# Patient Record
Sex: Female | Born: 1970 | Hispanic: Yes | Marital: Single | Smoking: Never smoker
Health system: Southern US, Community
[De-identification: ages and names within clinical notes are randomized; demographics above are authoritative.]

## PROBLEM LIST (undated history)

## (undated) DIAGNOSIS — F419 Anxiety disorder, unspecified: Secondary | ICD-10-CM

## (undated) DIAGNOSIS — I10 Essential (primary) hypertension: Secondary | ICD-10-CM

## (undated) DIAGNOSIS — F32A Depression, unspecified: Secondary | ICD-10-CM

## (undated) HISTORY — DX: Anxiety disorder, unspecified: F41.9

## (undated) HISTORY — DX: Depression, unspecified: F32.A

## (undated) HISTORY — DX: Essential (primary) hypertension: I10

---

## 2004-01-11 ENCOUNTER — Other Ambulatory Visit: Payer: Self-pay

## 2004-01-15 ENCOUNTER — Other Ambulatory Visit: Payer: Self-pay

## 2008-11-09 ENCOUNTER — Emergency Department: Payer: Self-pay | Admitting: Emergency Medicine

## 2011-05-29 ENCOUNTER — Emergency Department: Payer: Self-pay | Admitting: Emergency Medicine

## 2019-04-19 ENCOUNTER — Other Ambulatory Visit: Payer: Self-pay | Admitting: *Deleted

## 2019-04-19 DIAGNOSIS — Z20822 Contact with and (suspected) exposure to covid-19: Secondary | ICD-10-CM

## 2019-04-25 LAB — NOVEL CORONAVIRUS, NAA: SARS-CoV-2, NAA: NOT DETECTED

## 2019-05-08 ENCOUNTER — Telehealth: Payer: Self-pay

## 2019-05-08 NOTE — Telephone Encounter (Signed)
Pt. Called back and given COVID 19 results.Spanish interpreter # [phone].

## 2019-05-19 ENCOUNTER — Other Ambulatory Visit: Payer: Self-pay

## 2019-05-19 DIAGNOSIS — Z20822 Contact with and (suspected) exposure to covid-19: Secondary | ICD-10-CM

## 2019-05-21 LAB — NOVEL CORONAVIRUS, NAA: SARS-CoV-2, NAA: DETECTED — AB

## 2019-05-23 ENCOUNTER — Emergency Department: Payer: Self-pay

## 2019-05-23 ENCOUNTER — Emergency Department
Admission: EM | Admit: 2019-05-23 | Discharge: 2019-05-23 | Disposition: A | Discharge status: Home / Self Care | Payer: Self-pay | Attending: Emergency Medicine | Admitting: Emergency Medicine

## 2019-05-23 ENCOUNTER — Other Ambulatory Visit: Payer: Self-pay

## 2019-05-23 DIAGNOSIS — U071 COVID-19: Secondary | ICD-10-CM | POA: Insufficient documentation

## 2019-05-23 DIAGNOSIS — R0602 Shortness of breath: Secondary | ICD-10-CM | POA: Insufficient documentation

## 2019-05-23 DIAGNOSIS — J988 Other specified respiratory disorders: Secondary | ICD-10-CM | POA: Insufficient documentation

## 2019-05-23 DIAGNOSIS — R509 Fever, unspecified: Secondary | ICD-10-CM | POA: Insufficient documentation

## 2019-05-23 LAB — BASIC METABOLIC PANEL
Anion gap: 9 (ref 5–15)
BUN: 5 mg/dL — ABNORMAL LOW (ref 6–20)
CO2: 23 mmol/L (ref 22–32)
Calcium: 8.3 mg/dL — ABNORMAL LOW (ref 8.9–10.3)
Chloride: 108 mmol/L (ref 98–111)
Creatinine, Ser: 0.58 mg/dL (ref 0.44–1.00)
GFR calc Af Amer: >60 mL/min (ref >60)
GFR calc non Af Amer: >60 mL/min (ref >60)
Glucose, Bld: 113 mg/dL — ABNORMAL HIGH (ref 70–99)
Potassium: 3.7 mmol/L (ref 3.5–5.1)
Sodium: 140 mmol/L (ref 135–145)

## 2019-05-23 LAB — CBC
HCT: 41.6 % (ref 36.0–46.0)
Hemoglobin: 13.9 g/dL (ref 12.0–15.0)
MCH: 31.4 pg (ref 26.0–34.0)
MCHC: 33.4 g/dL (ref 30.0–36.0)
MCV: 94.1 fL (ref 80.0–100.0)
Platelets: 177 10*3/uL (ref 150–400)
RBC: 4.42 MIL/uL (ref 3.87–5.11)
RDW: 12.7 % (ref 11.5–15.5)
WBC: 3.3 10*3/uL — ABNORMAL LOW (ref 4.0–10.5)
nRBC: 0 % (ref 0.0–0.2)

## 2019-05-23 LAB — TROPONIN I (HIGH SENSITIVITY): Troponin I (High Sensitivity): 7 ng/L (ref <18)

## 2019-05-23 LAB — POCT PREGNANCY, URINE: Preg Test, Ur: NEGATIVE

## 2019-05-23 MED ORDER — GUAIFENESIN-CODEINE 100-10 MG/5ML PO SOLN: 5.0000 mL | Freq: Four times a day (QID) | ORAL | 0 refills | Status: AC | PRN

## 2019-05-23 MED ORDER — SODIUM CHLORIDE 0.9% FLUSH: 3.0000 mL | Freq: Once | INTRAVENOUS | Status: DC

## 2019-05-23 NOTE — ED Provider Notes (Signed)
[facility] Emergency Department Provider Note  Time seen: 9:37 PM  I have reviewed the triage vital signs and the nursing notes.   HISTORY  Chief Complaint Chest Pain, Shortness of Breath, and Diarrhea   HPI Brenda Rivas is a 48 y.o. female with no significant past medical history presents to the emergency department for worsening cough, shortness of breath and fevers.  According to the patient for approximately 1 week she has been experiencing symptoms of cough shortness of breath and fever.  States her daughter was recently diagnosed with coronavirus, patient was ultimately tested and tested positive for coronavirus as well.  Patient states he feels like the cough and shortness of breath have worsened so she came back to the emergency department for evaluation.  Patient denies any chest pain.   Overall patient appears well, currently satting 99 to 100% on room air.  History reviewed. No pertinent past medical history.  There are no active problems to display for this patient.   History reviewed. No pertinent surgical history.  Prior to Admission medications   Not on File    No Known Allergies  No family history on file.  Social History Social History   Tobacco Use  . Smoking status: Never Smoker  Substance Use Topics  . Alcohol use: Not Currently  . Drug use: Not on file    Review of Systems Constitutional: Continues to have low-grade fevers at home per patient ENT: Negative for recent illness/congestion Cardiovascular: Negative for chest pain. Respiratory: Positive for shortness of breath.  Positive for cough. Gastrointestinal: Negative for abdominal pain Musculoskeletal: Negative for musculoskeletal complaints Skin: Negative for skin complaints  Neurological: Negative for headache All other ROS negative  ____________________________________________   PHYSICAL EXAM:  VITAL SIGNS: ED Triage Vitals  Enc Vitals Group     BP  05/23/19 1507 (!) 140/92     Pulse Rate 05/23/19 1507 94     Resp 05/23/19 1507 18     Temp 05/23/19 1507 99.2 F (37.3 C)     Temp Source 05/23/19 1507 Oral     SpO2 05/23/19 1507 95 %     Weight 05/23/19 1508 210 lb (95.3 kg)     Height 05/23/19 1508 5\' 6"  (1.676 m)     Head Circumference --      Peak Flow --      Pain Score 05/23/19 1508 4     Pain Loc --      Pain Edu? --      Excl. in [place]? --    Constitutional: Alert and oriented. Well appearing and in no distress. Eyes: Normal exam ENT      Head: Normocephalic and atraumatic.      Mouth/Throat: Mucous membranes are moist. Cardiovascular: Normal rate, regular rhythm.  Respiratory: Normal respiratory effort without tachypnea nor retractions. Breath sounds are clear.  No wheeze rales or rhonchi. Gastrointestinal: Soft and nontender. No distention.   Musculoskeletal: Nontender with normal range of motion in all extremities. Neurologic:  Normal speech and language. No gross focal neurologic deficits  Skin:  Skin is warm, dry and intact.  Psychiatric: Mood and affect are normal.  ____________________________________________    EKG  EKG viewed and interpreted by myself shows a sinus rhythm at 94 bpm with a narrow QRS, normal axis, normal intervals, no concerning ST changes.  ____________________________________________    RADIOLOGY  IMPRESSION:  Subtle patchy infiltrate left base. Atelectasis with questionable  earliest changes of infiltrate and right base. Lungs  elsewhere  clear. Cardiac silhouette within normal limits. No evident  adenopathy.   ____________________________________________   INITIAL IMPRESSION / ASSESSMENT AND PLAN / ED COURSE  Pertinent labs & imaging results that were available during my care of the patient were reviewed by me and considered in my medical decision making (see chart for details).   Patient presents to the emergency department for continued shortness of breath and cough, recently  diagnosed with COVID-19.  Overall the patient appears well, no acute distress currently satting 99 to 100% on room air, borderline low-grade temperature otherwise reassuring vitals including normal pulse rate and respiratory rate.  Clear lung sounds on exam.  Patient's chest x-ray does show likely mild infiltrates.  Overall the patient appears very well, work-up is largely reassuring.  Symptoms along with the patient's known COVID-19 diagnosis.  I will prescribe the patient a cough medication.  Patient has an albuterol inhaler at home that her daughter was prescribed, states she has been using it I instructed her to use the inhaler 2 puffs every 6 hours.  I also discussed Tylenol ibuprofen every 6 hours at home plenty of fluids and plenty of rest.  Patient agreeable to plan of care.  Brenda Rivas was evaluated in Emergency Department on 05/23/2019 for the symptoms described in the history of present illness. She was evaluated in the context of the global COVID-19 pandemic, which necessitated consideration that the patient might be at risk for infection with the SARS-CoV-2 virus that causes COVID-19. Institutional protocols and algorithms that pertain to the evaluation of patients at risk for COVID-19 are in a state of rapid change based on information released by regulatory bodies including the CDC and federal and state organizations. These policies and algorithms were followed during the patient's care in the ED.  ____________________________________________   FINAL CLINICAL IMPRESSION(S) / ED DIAGNOSES  COVID-19   Minna AntisPaduchowski, Wilmar Prabhakar, MD 05/23/19 2140

## 2019-05-23 NOTE — ED Triage Notes (Signed)
Pt c/o chest pain, diarrhea and SOB X 10 days gradually worsening. Pt recently dx with COVID19 .

## 2019-05-23 NOTE — ED Notes (Signed)
Patient to stat desk complaining of shortness of breath. Patient in no acute distress. Vital signs stable at this time.

## 2019-05-23 NOTE — ED Notes (Signed)
Dr. Kerman Passey at bedside, pt speakiing with him via [place] interpreter, pt speaking in complete sentences with no difficulty

## 2020-01-22 ENCOUNTER — Ambulatory Visit: Payer: Self-pay | Attending: Internal Medicine

## 2020-01-22 DIAGNOSIS — Z23 Encounter for immunization: Secondary | ICD-10-CM

## 2020-01-22 NOTE — Progress Notes (Signed)
   Covid-19 Vaccination Clinic  Name:  Brenda Rivas    MRN: [id number] DOB: 03/30/1971  01/22/2020  Ms. Brenda Rivas was observed post Covid-19 immunization for 15 minutes without incident. She was provided with Vaccine Information Sheet and instruction to access the V-Safe system.   Ms. Brenda Rivas was instructed to call 911 with any severe reactions post vaccine: Marland Kitchen Difficulty breathing  . Swelling of face and throat  . A fast heartbeat  . A bad rash all over body  . Dizziness and weakness   Immunizations Administered    Name Date Dose VIS Date Route   Pfizer COVID-19 Vaccine 01/22/2020  3:26 PM 0.3 mL 10/03/2019 Intramuscular   Manufacturer: [employer], [employer]   Lot: [id number]   NDC: [address]-3

## 2020-02-15 ENCOUNTER — Ambulatory Visit: Payer: Self-pay | Attending: Internal Medicine

## 2020-02-15 DIAGNOSIS — Z23 Encounter for immunization: Secondary | ICD-10-CM

## 2020-02-15 NOTE — Progress Notes (Signed)
   Covid-19 Vaccination Clinic  Name:  Twilia Yaklin    MRN: [id number] DOB: 1971-06-02  02/15/2020  Ms. Martie Muhlbauer was observed post Covid-19 immunization for 15 minutes without incident. She was provided with Vaccine Information Sheet and instruction to access the V-Safe system.   Ms. Marc Sivertsen was instructed to call 911 with any severe reactions post vaccine: Marland Kitchen Difficulty breathing  . Swelling of face and throat  . A fast heartbeat  . A bad rash all over body  . Dizziness and weakness   Immunizations Administered    Name Date Dose VIS Date Route   Pfizer COVID-19 Vaccine 02/15/2020  3:28 PM 0.3 mL 12/17/2018 Intramuscular   Manufacturer: [employer], [employer]   Lot: [id number]   NDC: [address]-9

## 2021-08-16 ENCOUNTER — Encounter: Payer: Self-pay | Admitting: Obstetrics and Gynecology

## 2021-10-05 ENCOUNTER — Other Ambulatory Visit: Payer: Self-pay

## 2021-10-05 ENCOUNTER — Ambulatory Visit: Payer: Self-pay | Attending: Oncology | Admitting: *Deleted

## 2021-10-05 ENCOUNTER — Encounter: Payer: Self-pay | Admitting: *Deleted

## 2021-10-05 ENCOUNTER — Ambulatory Visit
Admission: RE | Admit: 2021-10-05 | Discharge: 2021-10-05 | Disposition: A | Discharge status: Home / Self Care | Payer: Self-pay | Source: Ambulatory Visit | Attending: Oncology | Admitting: Oncology

## 2021-10-05 VITALS — BP 127/79 | HR 68 | Temp 99.7°F | Ht 61.5 in | Wt 206.0 lb

## 2021-10-05 DIAGNOSIS — Z0000 Encounter for general adult medical examination without abnormal findings: Secondary | ICD-10-CM

## 2021-10-05 NOTE — Progress Notes (Signed)
°  Subjective:     Patient ID: Brenda Rivas, female   DOB: 1971-06-09, 50 y.o.   MRN: [id number]  HPI  [facility] Medical History Record - 10/05/21 1517       Breast History   Screening cycle New    CBE Date 02/23/20    Provider (CBE) [facility]    Initial Mammogram 10/05/21    Last Mammogram Annual    Last Mammogram Date 01/09/14    Provider (Mammogram)  [facility]    Recent Breast Symptoms None      Breast Cancer History   Breast Cancer History No personal or family history      Previous History of Breast Problems   Breast Surgery or Biopsy None    Breast Implants N/A    BSE Done Monthly      Gynecological/Obstetrical History   LMP 10/03/21    Is there any chance that the client could be pregnant?  No    Age at menarche 7    Age at menopause NA - perimenopausal    PAP smear history Annually    Date of last PAP  02/23/20    Provider (PAP) [facility]    Age at first live birth 45    Breast fed children No    DES Exposure Unkown    Cervical, Uterine or Ovarian cancer No    Family history of Cervial, Uterine or Ovarian cancer No    Hysterectomy No    Cervix removed No    Ovaries removed No    Laser/Cryosurgery Yes    Current method of birth control Depoprovera    Current method of Estrogen/Hormone replacement None    Smoking history None               Review of Systems     Objective:   Physical Exam Chest:  Breasts:    Right: No swelling, bleeding, inverted nipple, mass, nipple discharge, skin change or tenderness.     Left: No swelling, bleeding, inverted nipple, mass, nipple discharge, skin change or tenderness.  Lymphadenopathy:     Upper Body:     Right upper body: No supraclavicular or axillary adenopathy.     Left upper body: No supraclavicular or axillary adenopathy.       Assessment:     50 year old Hispanic female referred to [facility] by [facility] for clinical breast exam and mammogram.  Kandis Cocking, the interpreter  present during the interview and exam.  Clinical breast exam is unremarkable.  Taught self breast awareness.  Last pap on 02/23/20 was negative without HPV co-testing.  Next pap due in 2024.  Patient has been screened for eligibility.  She does not have any insurance, Medicare or Medicaid.  She also meets financial eligibility.     Plan:     Screening mammogram ordered.  Will follow up per [facility] protocol.

## 2021-10-06 ENCOUNTER — Other Ambulatory Visit: Payer: Self-pay | Admitting: *Deleted

## 2021-10-06 ENCOUNTER — Inpatient Hospital Stay
Admission: RE | Admit: 2021-10-06 | Discharge: 2021-10-06 | Disposition: A | Discharge status: Home / Self Care | Payer: Self-pay | Source: Ambulatory Visit | Attending: *Deleted | Admitting: *Deleted

## 2021-10-06 DIAGNOSIS — Z1231 Encounter for screening mammogram for malignant neoplasm of breast: Secondary | ICD-10-CM

## 2021-10-10 ENCOUNTER — Encounter: Payer: Self-pay | Admitting: *Deleted

## 2021-10-10 NOTE — Progress Notes (Signed)
lam

## 2021-11-30 ENCOUNTER — Encounter: Payer: Self-pay | Admitting: *Deleted

## 2021-11-30 NOTE — Progress Notes (Addendum)
Received voicemail message that patient did not have her mammogram results.  Called and left patient a message to return my call.  Informed her that a result letter was mailed on 10/10/21, but for her to call me back and I would review the results with her.  Patient returned my call.  She was informed of her normal mammogram and need to follow up in one year for her annual screening.

## 2023-08-03 IMAGING — MG MM DIGITAL SCREENING BILAT W/ TOMO AND CAD
6 of 12 series · 6 of 36 positions shown · non-contrast
Comparison: Previous exam(s).

ACR Breast Density Category a: The breast tissue is almost entirely
fatty.

CLINICAL DATA: Screening.

EXAM:
DIGITAL SCREENING BILATERAL MAMMOGRAM WITH TOMOSYNTHESIS AND CAD
TECHNIQUE: Bilateral screening digital craniocaudal and mediolateral oblique
mammograms were obtained. Bilateral screening digital breast
tomosynthesis was performed. The images were evaluated with
computer-aided detection.

[L CC synth-2D (1 of 2)]
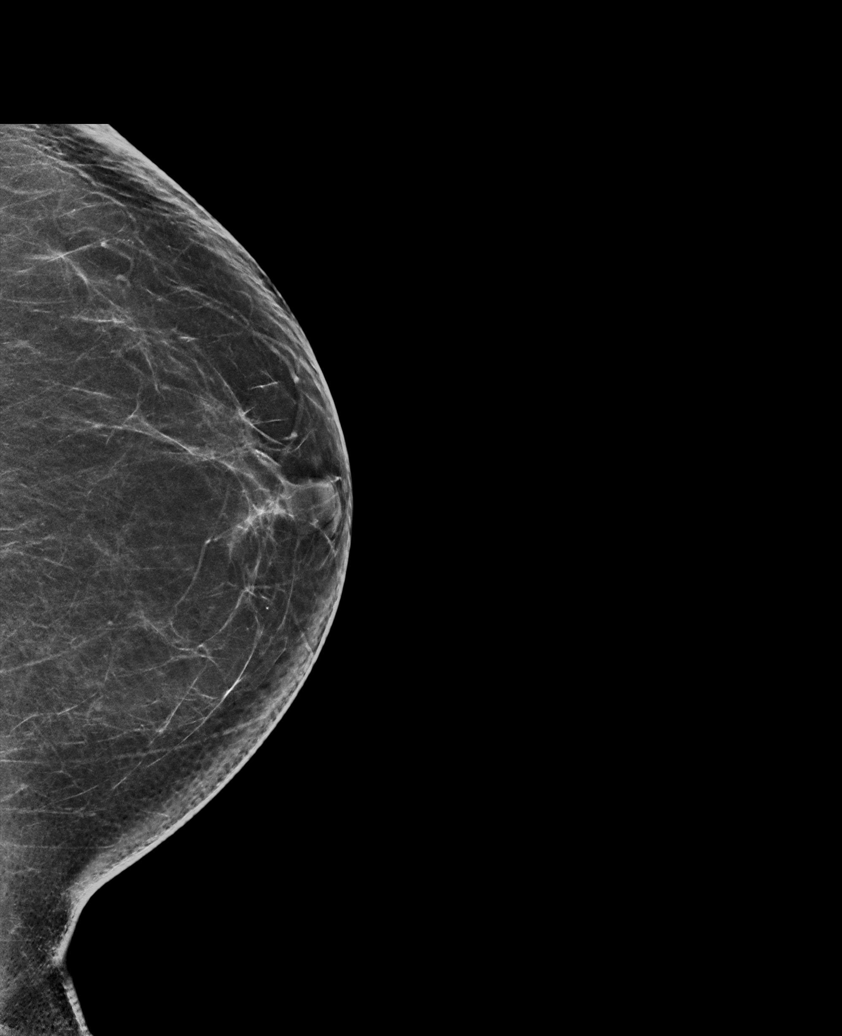

[R CC synth-2D (1 of 2)]
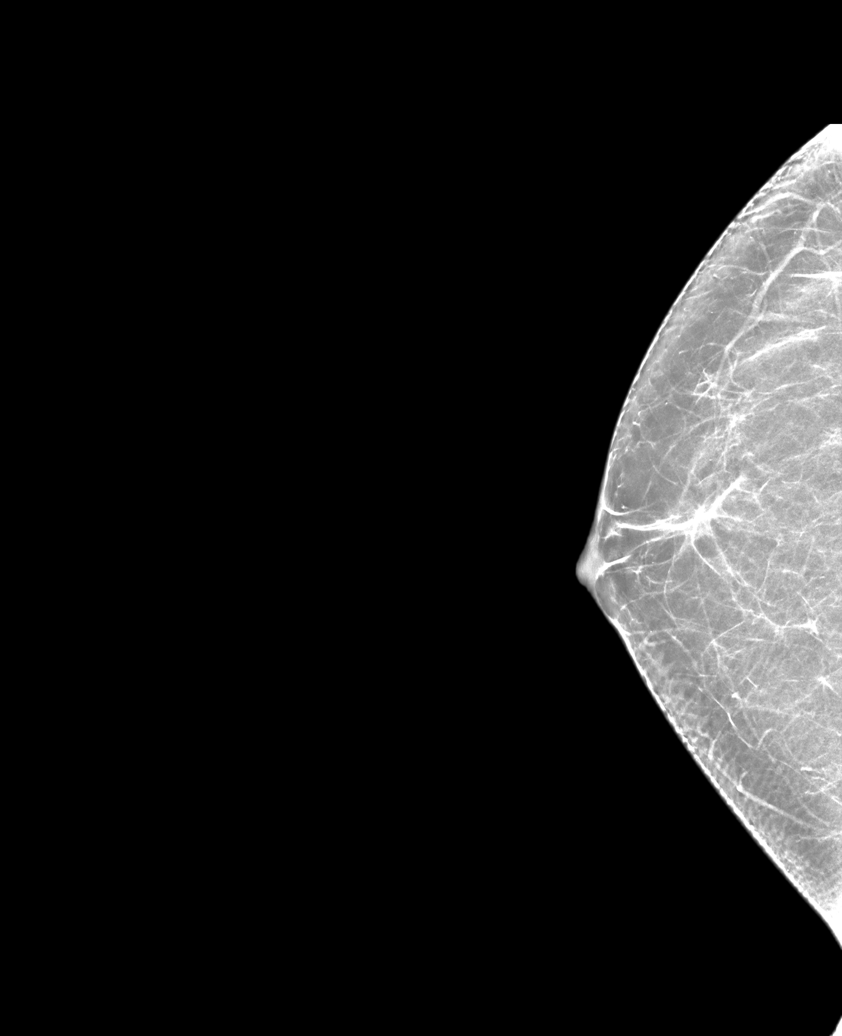

[R MLO synth-2D]
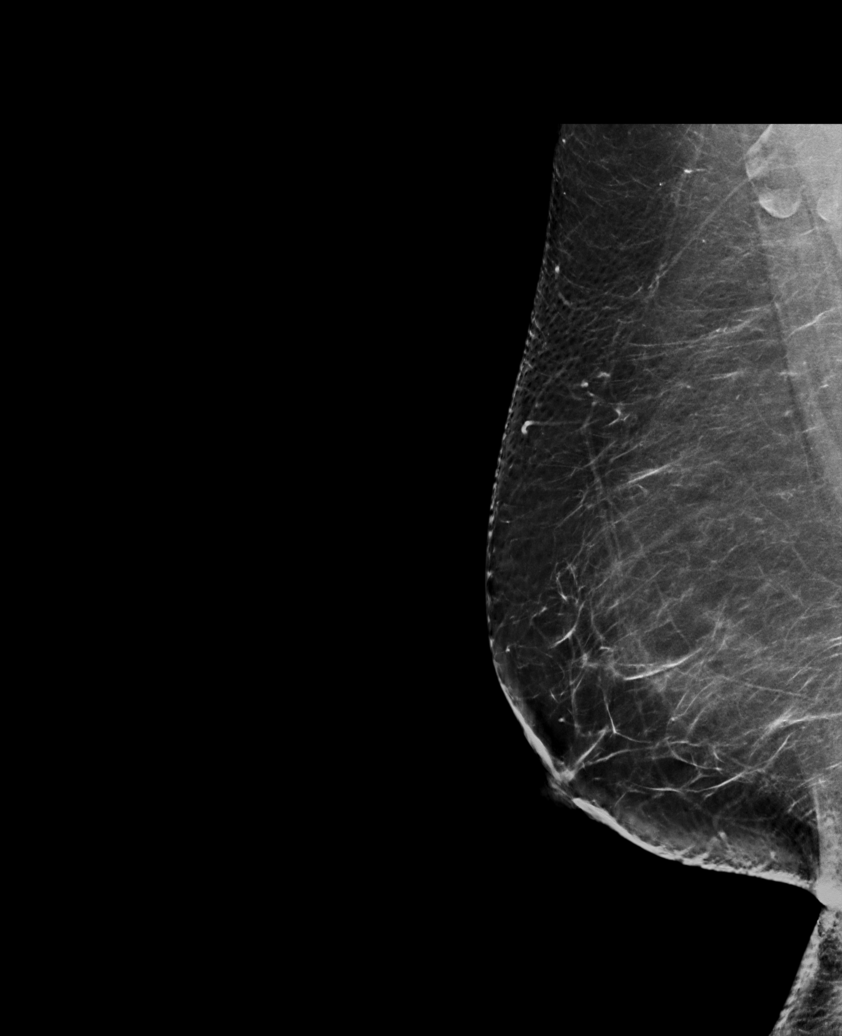

[L CC synth-2D (2 of 2)]
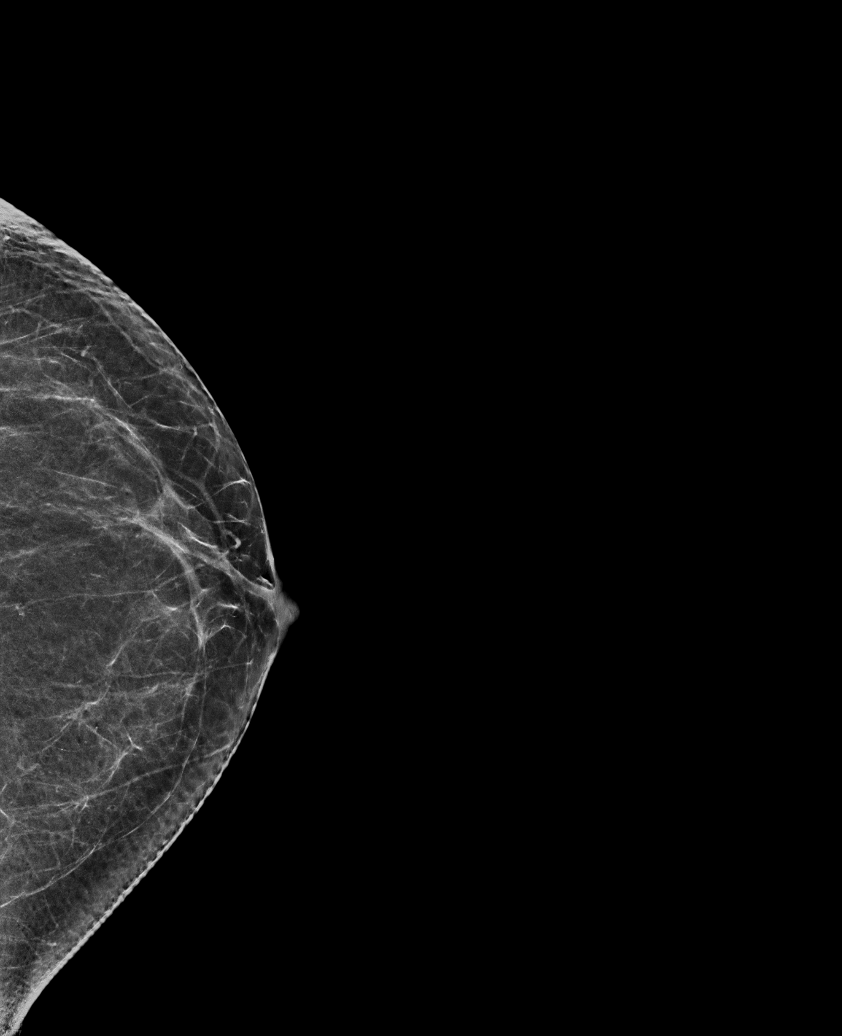

[L MLO synth-2D]
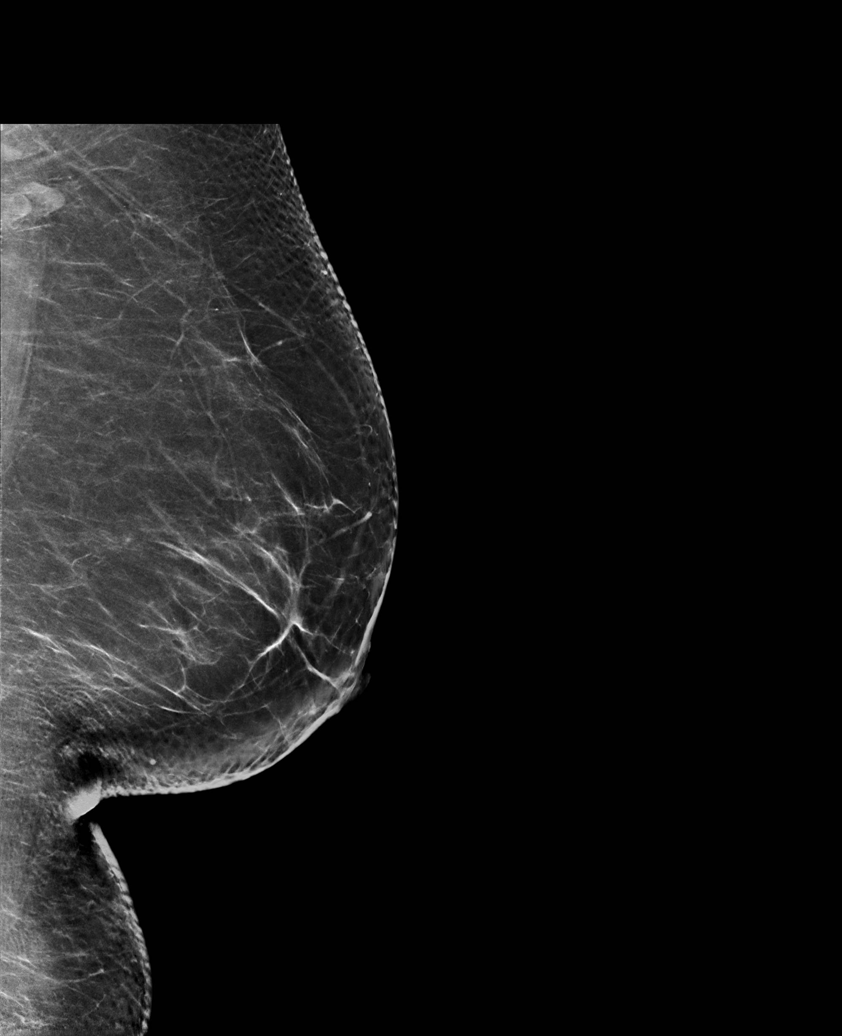

[R CC synth-2D (2 of 2)]
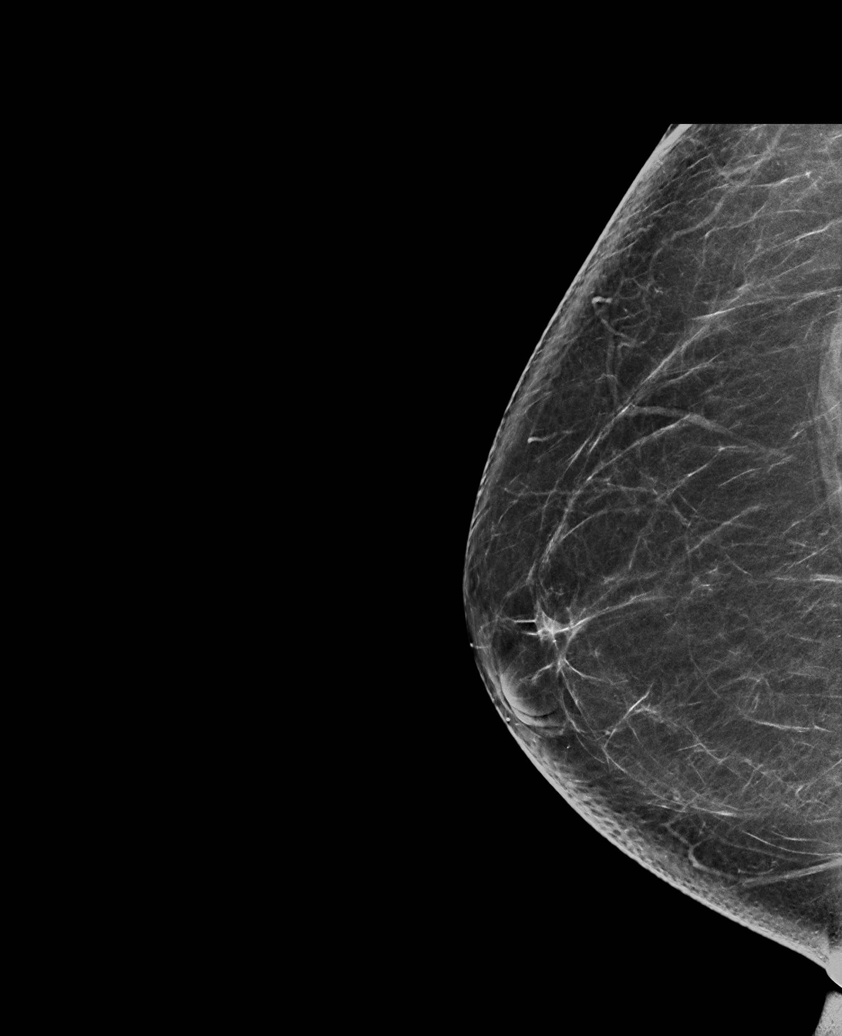

[6 of 36 positions shown; findings below may reference images not displayed]

FINDINGS: There are no findings suspicious for malignancy.
IMPRESSION: No mammographic evidence of malignancy. A result letter of this
screening mammogram will be mailed directly to the patient.

RECOMMENDATION:
Screening mammogram in one year. (Code:0E-3-N98)

BI-RADS CATEGORY  1: Negative.

## 2024-06-11 ENCOUNTER — Other Ambulatory Visit: Payer: Self-pay

## 2024-06-11 DIAGNOSIS — Z1231 Encounter for screening mammogram for malignant neoplasm of breast: Secondary | ICD-10-CM

## 2024-06-30 ENCOUNTER — Ambulatory Visit: Payer: Self-pay | Attending: Obstetrics and Gynecology | Admitting: *Deleted

## 2024-06-30 ENCOUNTER — Ambulatory Visit
Admission: RE | Admit: 2024-06-30 | Discharge: 2024-06-30 | Disposition: A | Discharge status: Home / Self Care | Payer: Self-pay | Source: Ambulatory Visit | Attending: Obstetrics and Gynecology | Admitting: Obstetrics and Gynecology

## 2024-06-30 VITALS — BP 146/91 | Wt 210.0 lb

## 2024-06-30 DIAGNOSIS — Z1231 Encounter for screening mammogram for malignant neoplasm of breast: Secondary | ICD-10-CM | POA: Insufficient documentation

## 2024-06-30 DIAGNOSIS — Z1239 Encounter for other screening for malignant neoplasm of breast: Secondary | ICD-10-CM

## 2024-06-30 NOTE — Progress Notes (Signed)
 Ms. Brenda Rivas is a 53 y.o. female who presents to [facility] clinic today with no complaints.    Pap Smear: Pap smear not completed today. Last Pap smear was 06/28/2023 at [facility] clinic and was normal. Per patient has history of an abnormal Pap smear in 2005 that a colposcopy was completed for follow up. Per patient all Pap smears have been normal since and she has had at least three normal Pap smears. Last Pap smear result is available in Epic.   Physical exam: Breasts Breasts symmetrical. No skin abnormalities bilateral breasts. No nipple retraction bilateral breasts. No nipple discharge bilateral breasts. No lymphadenopathy. No lumps palpated bilateral breasts. No complaints of pain or tenderness on exam.    MS DIGITAL SCREENING TOMO BILATERAL Result Date: 10/06/2021 CLINICAL DATA:  Screening. EXAM: DIGITAL SCREENING BILATERAL MAMMOGRAM WITH TOMOSYNTHESIS AND CAD TECHNIQUE: Bilateral screening digital craniocaudal and mediolateral oblique mammograms were obtained. Bilateral screening digital breast tomosynthesis was performed. The images were evaluated with computer-aided detection. COMPARISON:  Previous exam(s). ACR Breast Density Category a: The breast tissue is almost entirely fatty. FINDINGS: There are no findings suspicious for malignancy. IMPRESSION: No mammographic evidence of malignancy. A result letter of this screening mammogram will be mailed directly to the patient. RECOMMENDATION: Screening mammogram in one year. (Code:[id number]) BI-RADS CATEGORY  1: Negative. Electronically Signed   By: Almarie Daring M.D.   On: 10/06/2021 10:47   Pelvic/Bimanual Pap is not indicated today per [facility]CP guidelines.   Smoking History: Patient has never smoked.   Patient Navigation: Patient education provided. Access to services provided for patient through [employer] program. Spanish interpreter Damon Pierce from [facility] provided.   Colorectal Cancer Screening: Per patient has never  had colonoscopy completed. Patient stated she completed a FIT test given by her PCP 07/13/2023 that was negative. No complaints today.    Breast and Cervical Cancer Risk Assessment: Patient does not have family history of breast cancer, known genetic mutations, or radiation treatment to the chest before age 28. 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  Risk Assessment   No risk assessment data for the current encounter  Risk Scores       10/05/2021   Last edited by: Cindie Jesusa HERO, RN   5-year risk: 0.6%   Lifetime risk: 5.6%            A: [facility] exam without pap smear No complaints.   P: Referred patient to the [facility] for a screening mammogram. Appointment scheduled Monday, June 30, 2024 at 1120.  Driscilla Wanda SQUIBB, RN 06/30/2024 10:35 AM

## 2024-06-30 NOTE — Patient Instructions (Signed)
 Explained breast self awareness with Raoul Kristie Sprung. Patient did not need a Pap smear today due to last Pap smear was 06/28/2023. Let her know [facility] will cover Pap smears every 3 years unless has a history of abnormal Pap smears. Referred patient to the [facility] for a screening mammogram. Appointment scheduled Monday, June 30, 2024 at 1120. Patient aware of appointment and will be there. Let patient know Raymondo will follow up with her within the next couple weeks with results of her mammogram by letter or phone. Brenda Rivas verbalized understanding.  Johna Kearl, Wanda Ship, RN 10:35 AM
